# Patient Record
Sex: Male | Born: 1990 | Race: White | Hispanic: No | Marital: Single | Smoking: Current every day smoker
Health system: Southern US, Community
[De-identification: ages and names within clinical notes are randomized; demographics above are authoritative.]

## PROBLEM LIST (undated history)

## (undated) DIAGNOSIS — N200 Calculus of kidney: Secondary | ICD-10-CM

## (undated) DIAGNOSIS — F419 Anxiety disorder, unspecified: Secondary | ICD-10-CM

## (undated) DIAGNOSIS — F32A Depression, unspecified: Secondary | ICD-10-CM

## (undated) DIAGNOSIS — G43909 Migraine, unspecified, not intractable, without status migrainosus: Secondary | ICD-10-CM

## (undated) DIAGNOSIS — G4700 Insomnia, unspecified: Secondary | ICD-10-CM

---

## 2015-12-05 ENCOUNTER — Encounter (HOSPITAL_BASED_OUTPATIENT_CLINIC_OR_DEPARTMENT_OTHER): Payer: Self-pay

## 2015-12-05 ENCOUNTER — Emergency Department (HOSPITAL_BASED_OUTPATIENT_CLINIC_OR_DEPARTMENT_OTHER)
Admission: EM | Admit: 2015-12-05 | Discharge: 2015-12-05 | Disposition: A | Discharge status: Home / Self Care | Payer: Managed Care, Other (non HMO) | Attending: Emergency Medicine | Admitting: Emergency Medicine

## 2015-12-05 ENCOUNTER — Emergency Department (HOSPITAL_BASED_OUTPATIENT_CLINIC_OR_DEPARTMENT_OTHER): Payer: Managed Care, Other (non HMO)

## 2015-12-05 DIAGNOSIS — R110 Nausea: Secondary | ICD-10-CM | POA: Insufficient documentation

## 2015-12-05 DIAGNOSIS — R1031 Right lower quadrant pain: Secondary | ICD-10-CM | POA: Diagnosis not present

## 2015-12-05 DIAGNOSIS — F17200 Nicotine dependence, unspecified, uncomplicated: Secondary | ICD-10-CM | POA: Insufficient documentation

## 2015-12-05 HISTORY — DX: Calculus of kidney: N20.0

## 2015-12-05 LAB — CBC WITH DIFFERENTIAL/PLATELET
BASOS ABS: 0 10*3/uL (ref 0.0–0.1)
Basophils Relative: 0 %
EOS ABS: 0.1 10*3/uL (ref 0.0–0.7)
EOS PCT: 1 %
HCT: 47.1 % (ref 39.0–52.0)
HEMOGLOBIN: 16.2 g/dL (ref 13.0–17.0)
LYMPHS ABS: 3 10*3/uL (ref 0.7–4.0)
LYMPHS PCT: 27 %
MCH: 31.3 pg (ref 26.0–34.0)
MCHC: 34.4 g/dL (ref 30.0–36.0)
MCV: 91.1 fL (ref 78.0–100.0)
Monocytes Absolute: 1 10*3/uL (ref 0.1–1.0)
Monocytes Relative: 9 %
NEUTROS PCT: 63 %
Neutro Abs: 7 10*3/uL (ref 1.7–7.7)
PLATELETS: 208 10*3/uL (ref 150–400)
RBC: 5.17 MIL/uL (ref 4.22–5.81)
RDW: 12.4 % (ref 11.5–15.5)
WBC: 11.1 10*3/uL — AB (ref 4.0–10.5)

## 2015-12-05 LAB — COMPREHENSIVE METABOLIC PANEL
ALK PHOS: 61 U/L (ref 38–126)
ALT: 24 U/L (ref 17–63)
AST: 20 U/L (ref 15–41)
Albumin: 4.8 g/dL (ref 3.5–5.0)
Anion gap: 9 (ref 5–15)
BUN: 6 mg/dL (ref 6–20)
CALCIUM: 9.7 mg/dL (ref 8.9–10.3)
CHLORIDE: 103 mmol/L (ref 101–111)
CO2: 27 mmol/L (ref 22–32)
CREATININE: 0.84 mg/dL (ref 0.61–1.24)
GFR calc Af Amer: >60 mL/min (ref >60)
GFR calc non Af Amer: >60 mL/min (ref >60)
GLUCOSE: 94 mg/dL (ref 65–99)
Potassium: 4 mmol/L (ref 3.5–5.1)
SODIUM: 139 mmol/L (ref 135–145)
Total Bilirubin: 0.7 mg/dL (ref 0.3–1.2)
Total Protein: 7.3 g/dL (ref 6.5–8.1)

## 2015-12-05 LAB — LIPASE, BLOOD: Lipase: 15 U/L (ref 11–51)

## 2015-12-05 LAB — URINALYSIS, ROUTINE W REFLEX MICROSCOPIC
Bilirubin Urine: NEGATIVE
Glucose, UA: NEGATIVE mg/dL
HGB URINE DIPSTICK: NEGATIVE
Ketones, ur: NEGATIVE mg/dL
Leukocytes, UA: NEGATIVE
NITRITE: NEGATIVE
PROTEIN: NEGATIVE mg/dL
SPECIFIC GRAVITY, URINE: 1.005 (ref 1.005–1.030)
pH: 6.5 (ref 5.0–8.0)

## 2015-12-05 MED ORDER — MORPHINE SULFATE (PF) 4 MG/ML IV SOLN
4.0000 mg | Freq: Once | INTRAVENOUS | Status: AC
  Administered 2015-12-05: 4 mg via INTRAVENOUS
  Filled 2015-12-05: qty 1

## 2015-12-05 MED ORDER — ONDANSETRON HCL 4 MG/2ML IJ SOLN
4.0000 mg | Freq: Once | INTRAMUSCULAR | Status: AC
  Administered 2015-12-05: 4 mg via INTRAVENOUS
  Filled 2015-12-05: qty 2

## 2015-12-05 MED ORDER — ONDANSETRON 4 MG PO TBDP: ORAL_TABLET | ORAL | Status: DC

## 2015-12-05 MED ORDER — IOPAMIDOL (ISOVUE-300) INJECTION 61%
100.0000 mL | Freq: Once | INTRAVENOUS | Status: AC | PRN
  Administered 2015-12-05: 100 mL via INTRAVENOUS

## 2015-12-05 MED ORDER — SODIUM CHLORIDE 0.9 % IV BOLUS (SEPSIS)
1000.0000 mL | Freq: Once | INTRAVENOUS | Status: AC
  Administered 2015-12-05: 1000 mL via INTRAVENOUS

## 2015-12-05 NOTE — ED Notes (Signed)
RLQ pain and back pain started yesterday-nausea-no v/d-seen by PCP today-labs drawn no results-was advised to come to ED if developed fever-c/o chills x 1 hour-NAD-steady gait

## 2015-12-05 NOTE — Discharge Instructions (Signed)
Take 4 over the counter ibuprofen tablets 3 times a day or 2 over-the-counter naproxen tablets twice a day for pain. ° °Abdominal Pain, Adult °Many things can cause abdominal pain. Usually, abdominal pain is not caused by a disease and will improve without treatment. It can often be observed and treated at home. Your health care provider will do a physical exam and possibly order blood tests and X-rays to help determine the seriousness of your pain. However, in many cases, more time must pass before a clear cause of the pain can be found. Before that point, your health care provider may not know if you need more testing or further treatment. °HOME CARE INSTRUCTIONS °Monitor your abdominal pain for any changes. The following actions may help to alleviate any discomfort you are experiencing: °· Only take over-the-counter or prescription medicines as directed by your health care provider. °· Do not take laxatives unless directed to do so by your health care provider. °· Try a clear liquid diet (broth, tea, or water) as directed by your health care provider. Slowly move to a bland diet as tolerated. °SEEK MEDICAL CARE IF: °· You have unexplained abdominal pain. °· You have abdominal pain associated with nausea or diarrhea. °· You have pain when you urinate or have a bowel movement. °· You experience abdominal pain that wakes you in the night. °· You have abdominal pain that is worsened or improved by eating food. °· You have abdominal pain that is worsened with eating fatty foods. °· You have a fever. °SEEK IMMEDIATE MEDICAL CARE IF: °· Your pain does not go away within 2 hours. °· You keep throwing up (vomiting). °· Your pain is felt only in portions of the abdomen, such as the right side or the left lower portion of the abdomen. °· You pass bloody or black tarry stools. °MAKE SURE YOU: °· Understand these instructions. °· Will watch your condition. °· Will get help right away if you are not doing well or get worse. °  °This information is not intended to replace advice given to you by your health care provider. Make sure you discuss any questions you have with your health care provider. °  °Document Released: 03/06/2005 Document Revised: 02/15/2015 Document Reviewed: 02/03/2013 °Elsevier Interactive Patient Education ©2016 [employer] ° °

## 2015-12-05 NOTE — ED Notes (Signed)
Patient transported to CT 

## 2015-12-05 NOTE — ED Provider Notes (Signed)
CSN: [id number]     Arrival date & time 12/05/15  1755 History   By signing my name below, I, [facility], attest that this documentation has been prepared under the direction and in the presence of Melene Plan, DO. Electronically Signed: Randell Patient, ED Scribe. 12/05/2015. 8:02 PM.    Chief Complaint  Patient presents with  . Abdominal Pain    Patient is a 25 y.o. male presenting with abdominal pain. The history is provided by the patient. No language interpreter was used.  Abdominal Pain Pain location:  RLQ Pain quality: sharp   Pain radiates to:  Does not radiate Pain severity:  Moderate Onset quality:  Gradual Duration:  1 day Timing:  Constant Progression:  Worsening Chronicity:  New Context: not previous surgeries, not retching and not trauma   Relieved by:  None tried Worsened by:  Movement and palpation Ineffective treatments:  None tried Associated symptoms: chills and nausea   Associated symptoms: no chest pain, no diarrhea, no dysuria, no fever, no shortness of breath and no vomiting   Risk factors: not elderly and not pregnant    HPI Comments: Shneur Valdes is a 25 y.o. male who presents to the Emergency Department complaining of constant, gradually worsening, moderate, RLQ abdominal pain onset last night. Pt states that he had mild abdominal pain and nausea last night and was seen by PCP earlier today where he had labs drawn (results pending) and who advised the pt to present to the ED for further evaluation if fever or dizzness presented. He reports associated nausea, chills, and urinary frequency. He eaten a small amount today. Denies dysuria, testicular pain, vomiting, diarrhea, or any other symptoms currently.  Past Medical History  Diagnosis Date  . Kidney stone    History reviewed. No pertinent past surgical history. No family history on file. Social History  Substance Use Topics  . Smoking status: Current Every Day Smoker  . Smokeless tobacco:  None  . Alcohol Use: Yes     Comment: monthly    Review of Systems  Constitutional: Positive for chills. Negative for fever.  HENT: Negative for congestion and facial swelling.   Eyes: Negative for discharge and visual disturbance.  Respiratory: Negative for shortness of breath.   Cardiovascular: Negative for chest pain and palpitations.  Gastrointestinal: Positive for nausea and abdominal pain. Negative for vomiting and diarrhea.  Genitourinary: Positive for frequency. Negative for dysuria and testicular pain.  Musculoskeletal: Negative for myalgias and arthralgias.  Skin: Negative for color change and rash.  Neurological: Negative for tremors, syncope and headaches.  Psychiatric/Behavioral: Negative for confusion and dysphoric mood.  All other systems reviewed and are negative.     Allergies  Review of patient's allergies indicates no known allergies.  Home Medications   Prior to Admission medications   Medication Sig Start Date End Date Taking? Authorizing Provider  ondansetron (ZOFRAN ODT) 4 MG disintegrating tablet  ODT q4 hours prn nausea/vomit 12/05/15   Melene Plan, DO   BP 140/102 mmHg  Pulse 96  Temp(Src) 99 F (37.2 C) (Oral)  Resp 16  Ht  (1.854 m)  Wt 214 lb (97.07 kg)  BMI 28.24 kg/m2  SpO2 100% Physical Exam  Constitutional: He is oriented to person, place, and time. He appears well-developed and well-nourished.  HENT:  Head: Normocephalic and atraumatic.  Eyes: EOM are normal. Pupils are equal, round, and reactive to light.  Neck: Normal range of motion. Neck supple. No JVD present.  Cardiovascular: Normal rate and  regular rhythm.  Exam reveals no gallop and no friction rub.   No murmur heard. Pulmonary/Chest: No respiratory distress. He has no wheezes.  Abdominal: He exhibits no distension. There is tenderness in the right lower quadrant. There is rebound and tenderness at McBurney's point. There is no guarding and negative Murphy's sign.   Tenderness to the RLQ. There is some rebound. No guarding. Positive McBurney's point. Negative Murphy's sign. Negative obturator. Negative psoas. Negative Rovsing's.  Musculoskeletal: Normal range of motion.  Neurological: He is alert and oriented to person, place, and time.  Skin: No rash noted. No pallor.  Psychiatric: He has a normal mood and affect. His behavior is normal.  Nursing note and vitals reviewed.   ED Course  Procedures   DIAGNOSTIC STUDIES: Oxygen Saturation is 100% on RA, normal by my interpretation.    COORDINATION OF CARE: 7:21 PM Will order abdomen CT scan, labs, IV fluids, morphine, and Zofran. Discussed treatment plan with pt at bedside and pt agreed to plan.   Labs Review Labs Reviewed  CBC WITH DIFFERENTIAL/PLATELET - Abnormal; Notable for the following:    WBC 11.1 (*)    All other components within normal limits  URINALYSIS, ROUTINE W REFLEX MICROSCOPIC (NOT AT [facility])  COMPREHENSIVE METABOLIC PANEL  LIPASE, BLOOD    Imaging Review Ct Abdomen Pelvis W Contrast  12/05/2015  CLINICAL DATA:  25 year old male with right lower quadrant abdominal pain and nausea EXAM: CT ABDOMEN AND PELVIS WITH CONTRAST TECHNIQUE: Multidetector CT imaging of the abdomen and pelvis was performed using the standard protocol following bolus administration of intravenous contrast. CONTRAST:  100mL ISOVUE-300 IOPAMIDOL (ISOVUE-300) INJECTION 61% COMPARISON:  None. FINDINGS: The visualized lung bases are clear. No intra-abdominal free air or free fluid. The liver, gallbladder, pancreas, spleen, adrenal glands, kidneys, visualized ureters, and urinary bladder appear unremarkable. The prostate and seminal vesicles are grossly unremarkable. There is no evidence of bowel obstruction or active inflammation. Normal appendix. The abdominal aorta and IVC appear unremarkable. No portal venous gas identified. There is no adenopathy. There is a small fat containing umbilical hernia. The abdominal  wall soft tissues are otherwise unremarkable. The osseous structures are intact. IMPRESSION: No acute intra-abdominal or pelvic pathology.  Normal appendix. Electronically Signed   By: Elgie CollardArash  Radparvar M.D.   On: 12/05/2015 20:45   I have personally reviewed and evaluated these images and lab results as part of my medical decision-making.   EKG Interpretation None      MDM   Final diagnoses:  Right lower quadrant abdominal pain    25 yo M With a chief complaint of right lower quadrant abdominal tenderness. Significant tenderness on exam. CT scan is negative. Patient feeling better post fluids and pain medicine. PCP follow-up.  I personally performed the services described in this documentation, which was scribed in my presence. The recorded information has been reviewed and is accurate.   11:20 PM:  I have discussed the diagnosis/risks/treatment options with the patient and family and believe the pt to be eligible for discharge home to follow-up with PCP. We also discussed returning to the ED immediately if new or worsening sx occur. We discussed the sx which are most concerning (e.g., sudden worsening pain, fever, inability to tolerate by mouth) that necessitate immediate return. Medications administered to the patient during their visit and any new prescriptions provided to the patient are listed below.  Medications given during this visit Medications  sodium chloride 0.9 % bolus 1,000 mL (0 mLs Intravenous Stopped 12/05/15  2015)  morphine 4 MG/ML injection 4 mg (4 mg Intravenous Given 12/05/15 1934)  ondansetron (ZOFRAN) injection 4 mg (4 mg Intravenous Given 12/05/15 1934)  iopamidol (ISOVUE-300) 61 % injection 100 mL (100 mLs Intravenous Contrast Given 12/05/15 2036)    Discharge Medication List as of 12/05/2015  8:55 PM    START taking these medications   Details  ondansetron (ZOFRAN ODT) 4 MG disintegrating tablet 4mg  ODT q4 hours prn nausea/vomit, Print        The patient  appears reasonably screen and/or stabilized for discharge and I doubt any other medical condition or other [facility] requiring further screening, evaluation, or treatment in the ED at this time prior to discharge.     Melene Planan [place] Wiseman, DO 12/05/15 2321

## 2018-07-17 ENCOUNTER — Other Ambulatory Visit: Payer: Self-pay

## 2018-07-17 ENCOUNTER — Encounter (HOSPITAL_COMMUNITY): Payer: Self-pay | Admitting: Emergency Medicine

## 2018-07-17 ENCOUNTER — Emergency Department (HOSPITAL_COMMUNITY)
Admission: EM | Admit: 2018-07-17 | Discharge: 2018-07-17 | Disposition: A | Discharge status: Home / Self Care | Payer: BLUE CROSS/BLUE SHIELD | Attending: Emergency Medicine | Admitting: Emergency Medicine

## 2018-07-17 DIAGNOSIS — F17210 Nicotine dependence, cigarettes, uncomplicated: Secondary | ICD-10-CM | POA: Insufficient documentation

## 2018-07-17 DIAGNOSIS — J101 Influenza due to other identified influenza virus with other respiratory manifestations: Secondary | ICD-10-CM | POA: Insufficient documentation

## 2018-07-17 DIAGNOSIS — Z5181 Encounter for therapeutic drug level monitoring: Secondary | ICD-10-CM | POA: Diagnosis not present

## 2018-07-17 DIAGNOSIS — Z79899 Other long term (current) drug therapy: Secondary | ICD-10-CM | POA: Diagnosis not present

## 2018-07-17 DIAGNOSIS — Z113 Encounter for screening for infections with a predominantly sexual mode of transmission: Secondary | ICD-10-CM | POA: Diagnosis not present

## 2018-07-17 DIAGNOSIS — G43909 Migraine, unspecified, not intractable, without status migrainosus: Secondary | ICD-10-CM | POA: Diagnosis not present

## 2018-07-17 DIAGNOSIS — R05 Cough: Secondary | ICD-10-CM | POA: Diagnosis not present

## 2018-07-17 DIAGNOSIS — J111 Influenza due to unidentified influenza virus with other respiratory manifestations: Secondary | ICD-10-CM | POA: Diagnosis not present

## 2018-07-17 HISTORY — DX: Migraine, unspecified, not intractable, without status migrainosus: G43.909

## 2018-07-17 HISTORY — DX: Insomnia, unspecified: G47.00

## 2018-07-17 MED ORDER — ONDANSETRON 8 MG PO TBDP
8.0000 mg | ORAL_TABLET | Freq: Once | ORAL | Status: AC
  Administered 2018-07-17: 8 mg via ORAL
  Filled 2018-07-17: qty 1

## 2018-07-17 MED ORDER — ALBUTEROL SULFATE HFA 108 (90 BASE) MCG/ACT IN AERS
2.0000 | INHALATION_SPRAY | Freq: Once | RESPIRATORY_TRACT | Status: AC
  Administered 2018-07-17: 2 via RESPIRATORY_TRACT
  Filled 2018-07-17: qty 6.7

## 2018-07-17 MED ORDER — BENZONATATE 100 MG PO CAPS
200.0000 mg | ORAL_CAPSULE | Freq: Once | ORAL | Status: AC
  Administered 2018-07-17: 200 mg via ORAL
  Filled 2018-07-17: qty 2

## 2018-07-17 MED ORDER — ACETAMINOPHEN 325 MG PO TABS
ORAL_TABLET | ORAL | Status: AC
  Filled 2018-07-17: qty 2

## 2018-07-17 MED ORDER — IBUPROFEN 800 MG PO TABS: 800.0000 mg | ORAL_TABLET | Freq: Three times a day (TID) | ORAL | 0 refills | Status: DC

## 2018-07-17 MED ORDER — IBUPROFEN 400 MG PO TABS
600.0000 mg | ORAL_TABLET | Freq: Once | ORAL | Status: AC
  Administered 2018-07-17: 600 mg via ORAL
  Filled 2018-07-17: qty 2

## 2018-07-17 MED ORDER — BENZONATATE 200 MG PO CAPS: 200.0000 mg | ORAL_CAPSULE | Freq: Three times a day (TID) | ORAL | 0 refills | Status: DC | PRN

## 2018-07-17 MED ORDER — ACETAMINOPHEN 325 MG PO TABS: 650.0000 mg | ORAL_TABLET | Freq: Once | ORAL | Status: DC | PRN

## 2018-07-17 NOTE — ED Triage Notes (Signed)
Diagnosed with flu Wednesday. Started tamiflu Thursday. Here today for return of fever and hard to breath. Pt NAD in triage.pt has not taken any medication for fever.

## 2018-07-17 NOTE — Discharge Instructions (Addendum)
Continue taking your tamiflu as directed.  Take tylenol every 4 hrs for at least another 48 hrs.  Its important to drink plenty of fluids.  Follow-up with your doctor for recheck if needed

## 2018-07-17 NOTE — ED Provider Notes (Signed)
[facility]ERGENCY DEPARTMENT Provider Note   CSN: [id number] Arrival date & time: 07/17/18  2022     History   Chief Complaint Chief Complaint  Patient presents with  . Influenza    HPI Anthony Cruz is a 28 y.o. male.  HPI   Anthony Cruz is a 28 y.o. male who presents to the Emergency Department complaining of fever and return of body aches and cough.  He was diagnosed with the flu two days ago and has been taking tamiflu, tylenol and a decongestant.  Last dose of tylenol was earlier today.  Woke from a nap with chills and fever and tightness in his chest associated with his cough.  He denies sore throat, abdominal pain, vomiting and diarrhea.    Past Medical History:  Diagnosis Date  . Insomnia   . Kidney stone   . Migraines     There are no active problems to display for this patient.   History reviewed. No pertinent surgical history.    Home Medications    Prior to Admission medications   Medication Sig Start Date End Date Taking? Authorizing Provider  ondansetron (ZOFRAN ODT) 4 MG disintegrating tablet 4mg  ODT q4 hours prn nausea/vomit 12/05/15   Melene PlanFloyd, Dan, DO    Family History History reviewed. No pertinent family history.  Social History Social History   Tobacco Use  . Smoking status: Current Every Day Smoker  . Smokeless tobacco: Never Used  Substance Use Topics  . Alcohol use: Yes    Comment: monthly  . Drug use: Yes    Types: Marijuana     Allergies   Patient has no known allergies.   Review of Systems Review of Systems  Constitutional: Positive for fever. Negative for activity change, appetite change and chills.  HENT: Positive for congestion. Negative for facial swelling, rhinorrhea, sore throat and trouble swallowing.   Eyes: Negative for visual disturbance.  Respiratory: Positive for cough. Negative for shortness of breath, wheezing and stridor.   Cardiovascular: Negative for chest pain.  Gastrointestinal: Negative for abdominal  pain, nausea and vomiting.  Genitourinary: Negative for decreased urine volume and dysuria.  Musculoskeletal: Positive for myalgias (gneralized body aches). Negative for neck pain and neck stiffness.  Skin: Negative for rash.  Neurological: Negative for dizziness, weakness, numbness and headaches.  Hematological: Negative for adenopathy.  Psychiatric/Behavioral: Negative for confusion.  All other systems reviewed and are negative.    Physical Exam Updated Vital Signs BP 136/86 (BP Location: Right Arm)   Pulse (!) 122   Temp (!) 102.1 F (38.9 C) (Oral)   Resp 18   Ht 6\' 1"  (1.854 m)   Wt 93.9 kg   SpO2 99%   BMI 27.31 kg/m   Physical Exam Vitals signs and nursing note reviewed.  Constitutional:      General: He is not in acute distress.    Appearance: Normal appearance. He is well-developed. He is not ill-appearing.  HENT:     Head: Normocephalic.     Jaw: No trismus.     Right Ear: Tympanic membrane and ear canal normal.     Left Ear: Tympanic membrane and ear canal normal.     Nose: Mucosal edema and rhinorrhea present.     Mouth/Throat:     Pharynx: Uvula midline. Posterior oropharyngeal erythema present. No oropharyngeal exudate or uvula swelling.     Tonsils: No tonsillar abscesses.  Eyes:     Conjunctiva/sclera: Conjunctivae normal.  Neck:     Musculoskeletal: Full passive  range of motion without pain, normal range of motion and neck supple. No neck rigidity.     Trachea: Phonation normal.     Meningeal: Kernig's sign absent.  Cardiovascular:     Rate and Rhythm: Normal rate and regular rhythm.  Pulmonary:     Effort: Pulmonary effort is normal. No respiratory distress.     Breath sounds: Wheezing present. No rales.     Comments: occasional expiratory wheezes.  No rales or respiratory distress noted.   Abdominal:     General: There is no distension.     Palpations: Abdomen is soft.     Tenderness: There is no abdominal tenderness. There is no guarding or  rebound.  Musculoskeletal: Normal range of motion.  Lymphadenopathy:     Cervical: No cervical adenopathy.  Skin:    General: Skin is warm and dry.     Findings: No rash.  Neurological:     Mental Status: He is alert. Mental status is at baseline.     Sensory: No sensory deficit.     Motor: No abnormal muscle tone.      ED Treatments / Results  Labs (all labs ordered are listed, but only abnormal results are displayed) Labs Reviewed - No data to display  EKG None  Radiology No results found.  Procedures Procedures (including critical care time)  Medications Ordered in ED Medications  acetaminophen (TYLENOL) 325 MG tablet (  Not Given 07/17/18 2057)  albuterol (PROVENTIL HFA;VENTOLIN HFA) 108 (90 Base) MCG/ACT inhaler 2 puff (has no administration in time range)  benzonatate (TESSALON) capsule 200 mg (has no administration in time range)  ibuprofen (ADVIL,MOTRIN) tablet 600 mg (600 mg Oral Given 07/17/18 2059)     Initial Impression / Assessment and Plan / ED Course  I have reviewed the triage vital signs and the nursing notes.  Pertinent labs & imaging results that were available during my care of the patient were reviewed by me and considered in my medical decision making (see chart for details).     Pt with the flu.  Currently taking tamiflu and tylenol.   Albuterol MDI dispensed, lung sounds improved after 2 puffs.  Vitals improved after ibuprofen and oral fluids.  He appears appropriate for d/c home and rx written for ibuprofen and tessalon    Final Clinical Impressions(s) / ED Diagnoses   Final diagnoses:  Influenza    ED Discharge Orders    None       Pauline Aus, PA-C 07/18/18 1337    Vanetta Mulders, MD 07/18/18 1654

## 2018-07-17 NOTE — ED Notes (Signed)
Patient drinking oral fluids at this time

## 2018-10-16 DIAGNOSIS — Z5181 Encounter for therapeutic drug level monitoring: Secondary | ICD-10-CM | POA: Diagnosis not present

## 2018-11-20 DIAGNOSIS — Z5181 Encounter for therapeutic drug level monitoring: Secondary | ICD-10-CM | POA: Diagnosis not present

## 2018-11-20 DIAGNOSIS — M898X1 Other specified disorders of bone, shoulder: Secondary | ICD-10-CM | POA: Diagnosis not present

## 2018-11-20 DIAGNOSIS — J302 Other seasonal allergic rhinitis: Secondary | ICD-10-CM | POA: Diagnosis not present

## 2018-11-20 DIAGNOSIS — F419 Anxiety disorder, unspecified: Secondary | ICD-10-CM | POA: Diagnosis not present

## 2019-04-06 ENCOUNTER — Other Ambulatory Visit: Payer: Self-pay

## 2019-04-06 ENCOUNTER — Emergency Department (HOSPITAL_COMMUNITY)
Admission: EM | Admit: 2019-04-06 | Discharge: 2019-04-06 | Disposition: A | Discharge status: Home / Self Care | Payer: BC Managed Care – PPO | Attending: Emergency Medicine | Admitting: Emergency Medicine

## 2019-04-06 ENCOUNTER — Encounter (HOSPITAL_COMMUNITY): Payer: Self-pay

## 2019-04-06 ENCOUNTER — Emergency Department (HOSPITAL_COMMUNITY): Payer: BC Managed Care – PPO

## 2019-04-06 DIAGNOSIS — R39198 Other difficulties with micturition: Secondary | ICD-10-CM | POA: Diagnosis not present

## 2019-04-06 DIAGNOSIS — F1210 Cannabis abuse, uncomplicated: Secondary | ICD-10-CM | POA: Diagnosis not present

## 2019-04-06 DIAGNOSIS — N200 Calculus of kidney: Secondary | ICD-10-CM | POA: Insufficient documentation

## 2019-04-06 DIAGNOSIS — R1032 Left lower quadrant pain: Secondary | ICD-10-CM | POA: Diagnosis not present

## 2019-04-06 DIAGNOSIS — F17210 Nicotine dependence, cigarettes, uncomplicated: Secondary | ICD-10-CM | POA: Insufficient documentation

## 2019-04-06 DIAGNOSIS — R109 Unspecified abdominal pain: Secondary | ICD-10-CM | POA: Diagnosis not present

## 2019-04-06 LAB — URINALYSIS, ROUTINE W REFLEX MICROSCOPIC
Bacteria, UA: NONE SEEN
Glucose, UA: NEGATIVE mg/dL
Ketones, ur: NEGATIVE mg/dL
Leukocytes,Ua: NEGATIVE
Nitrite: NEGATIVE
Protein, ur: 30 mg/dL — AB
Specific Gravity, Urine: 1.023 (ref 1.005–1.030)
pH: 6 (ref 5.0–8.0)

## 2019-04-06 MED ORDER — HYDROCODONE-ACETAMINOPHEN 5-325 MG PO TABS: 1.0000 | ORAL_TABLET | Freq: Four times a day (QID) | ORAL | 0 refills | Status: DC | PRN

## 2019-04-06 MED ORDER — KETOROLAC TROMETHAMINE 30 MG/ML IJ SOLN
30.0000 mg | Freq: Once | INTRAMUSCULAR | Status: AC
  Administered 2019-04-06: 30 mg via INTRAVENOUS
  Filled 2019-04-06: qty 1

## 2019-04-06 NOTE — ED Provider Notes (Signed)
[facility] EMERGENCY DEPARTMENT Provider Note   CSN: [id number] Arrival date & time: 04/06/19  1854     History   Chief Complaint Chief Complaint  Patient presents with  . Flank Pain    HPI Anthony Cruz is a 28 y.o. male.     Patient is a 28 year old male with history of prior renal calculus 7 years ago presenting with complaints of left flank pain.  This started suddenly this afternoon.  He has the urge to urinate but is having difficulty going.  This was accompanied by pain in his left lower quadrant radiating into his flank.  This feels similar to prior calculi.  He denies fevers or chills.  The history is provided by the patient.  Flank Pain This is a recurrent problem. The current episode started 3 to 5 hours ago. The problem occurs constantly. The problem has been rapidly worsening. Nothing aggravates the symptoms. Nothing relieves the symptoms. He has tried nothing for the symptoms.    Past Medical History:  Diagnosis Date  . Insomnia   . Kidney stone   . Migraines     There are no active problems to display for this patient.   History reviewed. No pertinent surgical history.      Home Medications    Prior to Admission medications   Medication Sig Start Date End Date Taking? Authorizing Provider  benzonatate (TESSALON) 200 MG capsule Take 1 capsule (200 mg total) by mouth 3 (three) times daily as needed for cough. Swallow whole, do not chew 07/17/18   Triplett, Tammy, PA-C  ibuprofen (ADVIL,MOTRIN) 800 MG tablet Take 1 tablet (800 mg total) by mouth 3 (three) times daily. 07/17/18   Triplett, Tammy, PA-C  ondansetron (ZOFRAN ODT) 4 MG disintegrating tablet 4mg  ODT q4 hours prn nausea/vomit 12/05/15   Deno Etienne, DO    Family History No family history on file.  Social History Social History   Tobacco Use  . Smoking status: Current Every Day Smoker    Packs/day: 0.50    Types: Cigarettes  . Smokeless tobacco: Never Used  Substance Use Topics  . Alcohol  use: Yes    Comment: monthly  . Drug use: Yes    Types: Marijuana     Allergies   Patient has no known allergies.   Review of Systems Review of Systems  Genitourinary: Positive for flank pain.  All other systems reviewed and are negative.    Physical Exam Updated Vital Signs BP (!) 144/97 (BP Location: Right Arm)   Pulse 95   Temp 98.9 F (37.2 C) (Oral)   Resp 18   Ht 6\' 1"  (1.854 m)   Wt 95.3 kg   SpO2 100%   BMI 27.71 kg/m   Physical Exam Vitals signs and nursing note reviewed.  Constitutional:      General: He is not in acute distress.    Appearance: He is well-developed. He is not diaphoretic.  HENT:     Head: Normocephalic and atraumatic.  Neck:     Musculoskeletal: Normal range of motion and neck supple.  Cardiovascular:     Rate and Rhythm: Normal rate and regular rhythm.     Heart sounds: No murmur. No friction rub.  Pulmonary:     Effort: Pulmonary effort is normal. No respiratory distress.     Breath sounds: Normal breath sounds. No wheezing or rales.  Abdominal:     General: Bowel sounds are normal. There is no distension.     Palpations: Abdomen is  soft.     Tenderness: There is no abdominal tenderness. There is left CVA tenderness.     Comments: There is mild left CVA tenderness noted.  Musculoskeletal: Normal range of motion.  Skin:    General: Skin is warm and dry.  Neurological:     Mental Status: He is alert and oriented to person, place, and time.     Coordination: Coordination normal.      ED Treatments / Results  Labs (all labs ordered are listed, but only abnormal results are displayed) Labs Reviewed  URINALYSIS, ROUTINE W REFLEX MICROSCOPIC - Abnormal; Notable for the following components:      Result Value   APPearance HAZY (*)    Hgb urine dipstick LARGE (*)    Bilirubin Urine SMALL (*)    Protein, ur 30 (*)    All other components within normal limits    EKG None  Radiology No results found.  Procedures  Procedures (including critical care time)  Medications Ordered in ED Medications  ketorolac (TORADOL) 30 MG/ML injection 30 mg (has no administration in time range)     Initial Impression / Assessment and Plan / ED Course  I have reviewed the triage vital signs and the nursing notes.  Pertinent labs & imaging results that were available during my care of the patient were reviewed by me and considered in my medical decision making (see chart for details).  Patient CT scan shows a punctate calculus in the left UVJ.  Patient feeling better after Toradol.  He will be discharged with hydrocodone and follow-up with urology if not improving.  Final Clinical Impressions(s) / ED Diagnoses   Final diagnoses:  None    ED Discharge Orders    None       Geoffery Lyons, MD 04/06/19 2224

## 2019-04-06 NOTE — ED Triage Notes (Signed)
Pt presents to ED with complaints of left sided flank pain and difficulty urinating. Pt states urine is also orange in color. Pt states this started 2-3 hours ago.

## 2019-04-06 NOTE — Discharge Instructions (Addendum)
Hydrocodone as prescribed as needed for pain.  Follow-up with urology if your symptoms or not improving in the next few days.  The contact information for [facility] in [place] has been provided in this discharge summary for you to call and make these arrangements.  Return to the emergency department if your symptoms worsen, you develop high fever, or for other new and concerning symptoms.

## 2019-05-10 DIAGNOSIS — Z23 Encounter for immunization: Secondary | ICD-10-CM | POA: Diagnosis not present

## 2019-05-10 DIAGNOSIS — F419 Anxiety disorder, unspecified: Secondary | ICD-10-CM | POA: Diagnosis not present

## 2019-05-10 DIAGNOSIS — F5101 Primary insomnia: Secondary | ICD-10-CM | POA: Diagnosis not present

## 2019-05-10 DIAGNOSIS — B070 Plantar wart: Secondary | ICD-10-CM | POA: Diagnosis not present

## 2019-11-22 ENCOUNTER — Emergency Department (HOSPITAL_COMMUNITY)
Admission: EM | Admit: 2019-11-22 | Discharge: 2019-11-23 | Disposition: A | Discharge status: Home / Self Care | Payer: BC Managed Care – PPO | Attending: Emergency Medicine | Admitting: Emergency Medicine

## 2019-11-22 ENCOUNTER — Emergency Department (HOSPITAL_COMMUNITY): Payer: BC Managed Care – PPO

## 2019-11-22 ENCOUNTER — Other Ambulatory Visit: Payer: Self-pay

## 2019-11-22 DIAGNOSIS — M549 Dorsalgia, unspecified: Secondary | ICD-10-CM | POA: Diagnosis not present

## 2019-11-22 NOTE — ED Triage Notes (Signed)
Patient states pain under his right rib cage area and that he is also having pain in his back. Patient denies any known injury. Patient concerned about pain and wanting an x-ray. Pain has been ongoing x 2 days.

## 2019-12-04 ENCOUNTER — Ambulatory Visit
Admission: EM | Admit: 2019-12-04 | Discharge: 2019-12-04 | Disposition: A | Discharge status: Home / Self Care | Payer: BC Managed Care – PPO | Attending: Emergency Medicine | Admitting: Emergency Medicine

## 2019-12-04 DIAGNOSIS — M62838 Other muscle spasm: Secondary | ICD-10-CM

## 2019-12-04 DIAGNOSIS — M5489 Other dorsalgia: Secondary | ICD-10-CM

## 2019-12-04 DIAGNOSIS — M549 Dorsalgia, unspecified: Secondary | ICD-10-CM | POA: Diagnosis not present

## 2019-12-04 MED ORDER — CYCLOBENZAPRINE HCL 10 MG PO TABS: 10.0000 mg | ORAL_TABLET | Freq: Every day | ORAL | 0 refills | Status: AC

## 2019-12-04 NOTE — ED Triage Notes (Signed)
Provider triage  

## 2019-12-04 NOTE — Discharge Instructions (Signed)
Continue conservative management of rest, ice, massage, and gentle stretches Continue with steroid as prescribed and to completion Take cyclobenzaprine at nighttime for symptomatic relief. Avoid driving or operating heavy machinery while using medication. Follow up with PCP if symptoms persist Return or go to the ER if you have any new or worsening symptoms (fever, chills, chest pain, abdominal pain, changes in bowel or bladder habits, pain radiating into lower legs, etc...)

## 2019-12-04 NOTE — ED Provider Notes (Signed)
[facility]   [id number] 12/04/19 Arrival Time: 1442  CC: Back PAIN  SUBJECTIVE: History from: patient. Anthony Cruz is a 29 y.o. male complains of LT upper back x 1 week.  Denies a precipitating event or specific injury.  Localizes the pain to the LT upper back and radiating in RT mid back.  Describes the pain as constant, dull and achy in character.  Has tried steroids, icing, heating, icy hot, and tylenol without relief.  Symptoms are made worse with movement.  Denies similar symptoms in the past.  Denies fever, chills, erythema, ecchymosis, effusion, weakness, numbness and tingling, saddle paresthesias, loss of bowel or bladder function.      ROS: As per HPI.  All other pertinent ROS negative.     Past Medical History:  Diagnosis Date  . Insomnia   . Kidney stone   . Migraines    No past surgical history on file. No Known Allergies No current facility-administered medications on file prior to encounter.   Current Outpatient Medications on File Prior to Encounter  Medication Sig Dispense Refill  . albuterol (VENTOLIN HFA) 108 (90 Base) MCG/ACT inhaler Inhale 1-2 puffs into the lungs every 6 (six) hours as needed for wheezing or shortness of breath.     Marland Kitchen amitriptyline (ELAVIL) 25 MG tablet Take 25 mg by mouth at bedtime.    . busPIRone (BUSPAR) 10 MG tablet Take 10 mg by mouth 2 (two) times daily.    . fluticasone (FLONASE) 50 MCG/ACT nasal spray Place 2 sprays into both nostrils daily as needed for allergies or rhinitis.     Marland Kitchen montelukast (SINGULAIR) 10 MG tablet Take 10 mg by mouth daily.    . SUMAtriptan (IMITREX) 50 MG tablet Take 50 mg by mouth every 2 (two) hours as needed for migraine or headache.     . zolpidem (AMBIEN) 10 MG tablet Take 10 mg by mouth at bedtime as needed for sleep.      Social History   Socioeconomic History  . Marital status: Single    Spouse name: Not on file  . Number of children: Not on file  . Years of education: Not on file  .  Highest education level: Not on file  Occupational History  . Not on file  Tobacco Use  . Smoking status: Current Every Day Smoker    Packs/day: 0.50    Types: Cigarettes  . Smokeless tobacco: Never Used  Vaping Use  . Vaping Use: Never used  Substance and Sexual Activity  . Alcohol use: Yes    Comment: monthly  . Drug use: Yes    Types: Marijuana  . Sexual activity: Not on file  Other Topics Concern  . Not on file  Social History Narrative  . Not on file   Social Determinants of Health   Financial Resource Strain:   . Difficulty of Paying Living Expenses:   Food Insecurity:   . Worried About Programme researcher, broadcasting/film/video in the Last Year:   . Barista in the Last Year:   Transportation Needs:   . Freight forwarder (Medical):   Marland Kitchen Lack of Transportation (Non-Medical):   Physical Activity:   . Days of Exercise per Week:   . Minutes of Exercise per Session:   Stress:   . Feeling of Stress :   Social Connections:   . Frequency of Communication with Friends and Family:   . Frequency of Social Gatherings with Friends and Family:   . Attends  Religious Services:   . Active Member of Clubs or Organizations:   . Attends Archivist Meetings:   Marland Kitchen Marital Status:   Intimate Partner Violence:   . Fear of Current or Ex-Partner:   . Emotionally Abused:   Marland Kitchen Physically Abused:   . Sexually Abused:    No family history on file.  OBJECTIVE:  Vitals:   12/04/19 1447  BP: (!) 144/93  Pulse: 87  Resp: 17  Temp: 98.3 F (36.8 C)  TempSrc: Oral  SpO2: 96%    General appearance: ALERT; in no acute distress.  Head: NCAT Lungs: Normal respiratory effort; CTAB CV: RRR Musculoskeletal: Back  Inspection: Skin warm, dry, clear and intact without obvious erythema, effusion, or ecchymosis.  Palpation: TTP over RT upper back/ trapezius ROM: FROM active and passive Strength: 5/5 shld abduction, 5/5 shld adduction, 5/5 elbow flexion, 5/5 elbow extension, 5/5 grip  strength, 5/5 hip flexion, 5/5 hip extension Skin: warm and dry Neurologic: Ambulates without difficulty; Sensation intact about the upper/ lower extremities Psychological: alert and cooperative; normal mood and affect   ASSESSMENT & PLAN:  1. Other acute back pain   2. Trapezius muscle spasm     Meds ordered this encounter  Medications  . cyclobenzaprine (FLEXERIL) 10 MG tablet    Sig: Take 1 tablet (10 mg total) by mouth at bedtime.    Dispense:  15 tablet    Refill:  0    Order Specific Question:   Supervising Provider    Answer:   Raylene Everts [[id number]]    Continue conservative management of rest, ice, massage, and gentle stretches Continue with steroid as prescribed and to completion Take cyclobenzaprine at nighttime for symptomatic relief. Avoid driving or operating heavy machinery while using medication. Follow up with PCP if symptoms persist Return or go to the ER if you have any new or worsening symptoms (fever, chills, chest pain, abdominal pain, changes in bowel or bladder habits, pain radiating into lower legs, etc...)    Reviewed expectations re: course of current medical issues. Questions answered. Outlined signs and symptoms indicating need for more acute intervention. Patient verbalized understanding. After Visit Summary given.    Lestine Box, PA-C 12/04/19 1458

## 2020-06-02 IMAGING — CT CT RENAL STONE PROTOCOL
2 of 4 series · 16 of 46 positions shown, 18 images · non-contrast
Comparison: CT of the abdomen pelvis dated 12/05/2015

CLINICAL DATA: 28-year-old male with right flank pain. Concern for
kidney stone.

EXAM:
CT ABDOMEN AND PELVIS WITHOUT CONTRAST
TECHNIQUE: Multidetector CT imaging of the abdomen and pelvis was performed
following the standard protocol without IV contrast.

[Series 2: axial st · axial · 0.85mm/px · z∈[+793,+1253]mm · 13 of 108 slices shown, 15 images]
[im 8/108  soft-tissue]
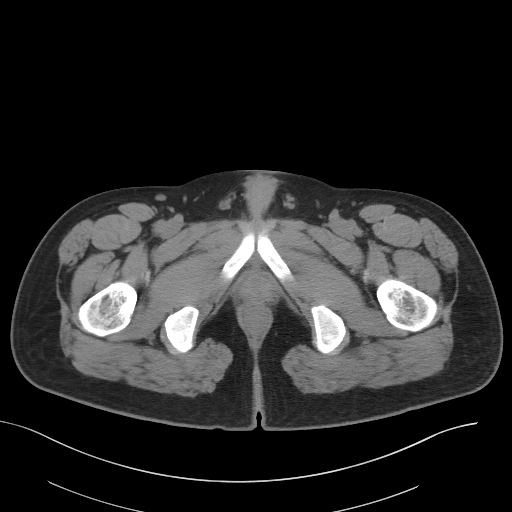
[im 8/108  bone]
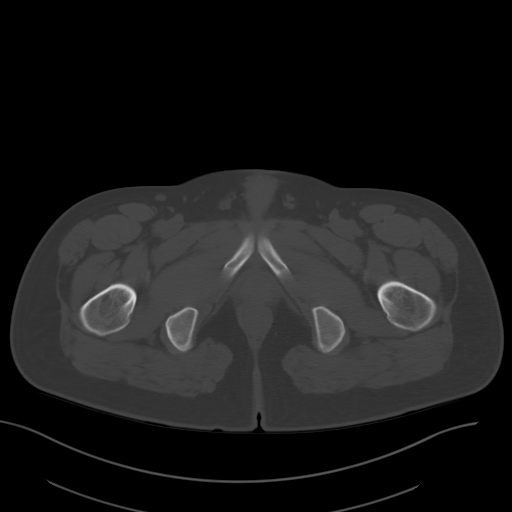
[im 15/108  soft-tissue]
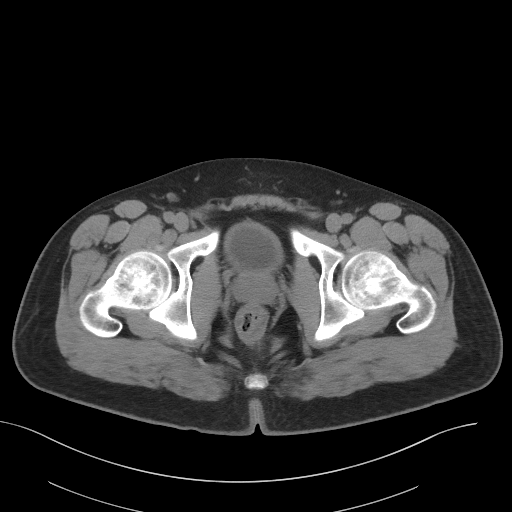
[im 22/108  soft-tissue]
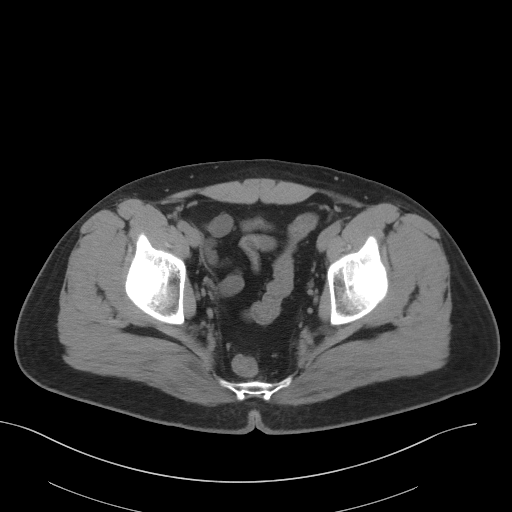
[im 29/108  soft-tissue]
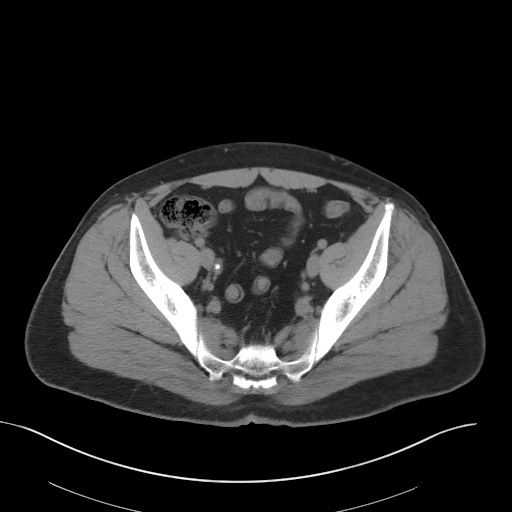
[im 36/108  soft-tissue]
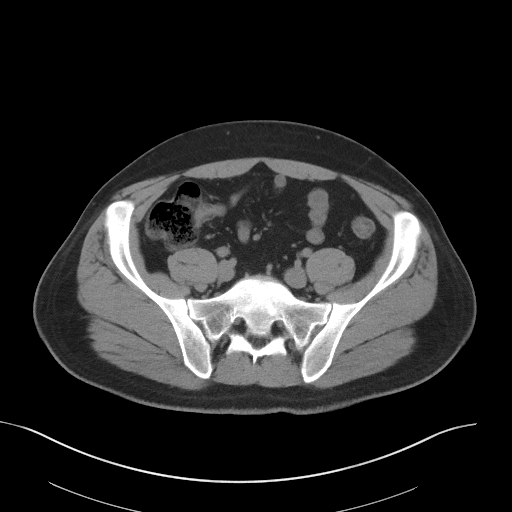
[im 43/108  soft-tissue]
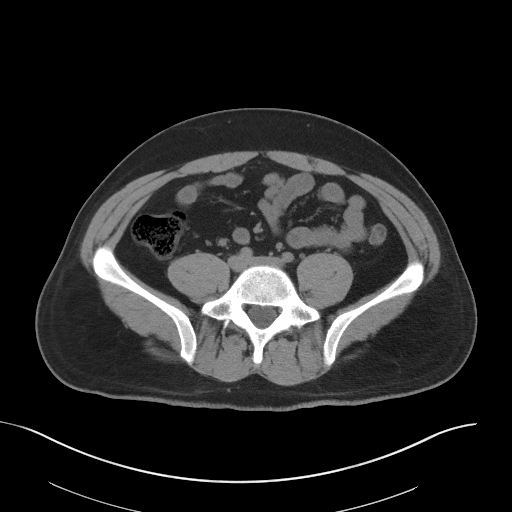
[im 58/108  soft-tissue]
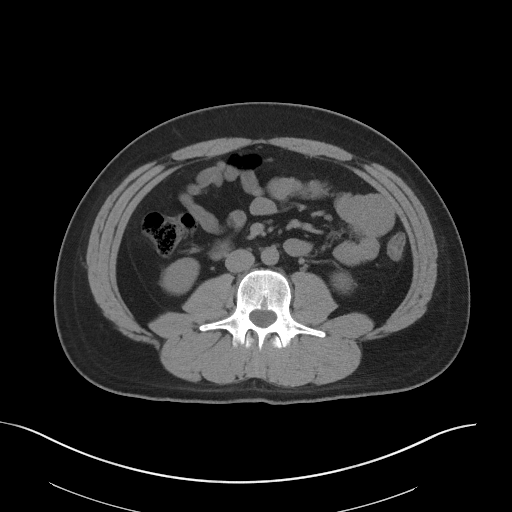
[im 65/108  soft-tissue]
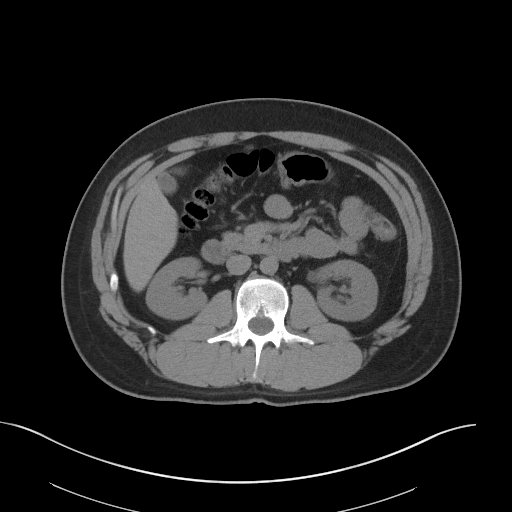
[im 72/108  soft-tissue]
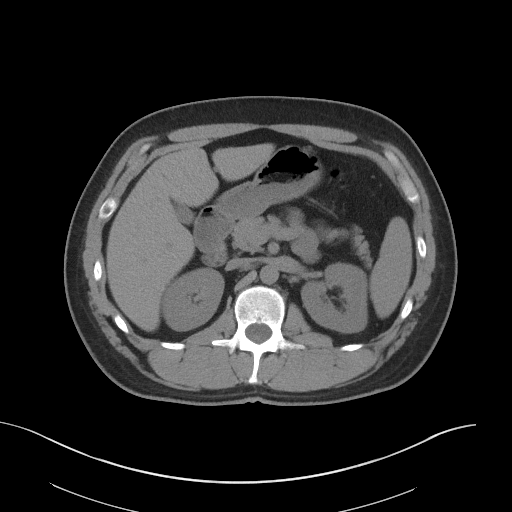
[im 72/108  bone]
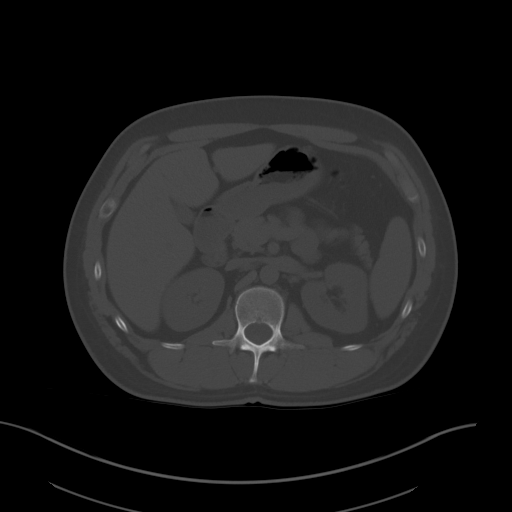
[im 79/108  soft-tissue]
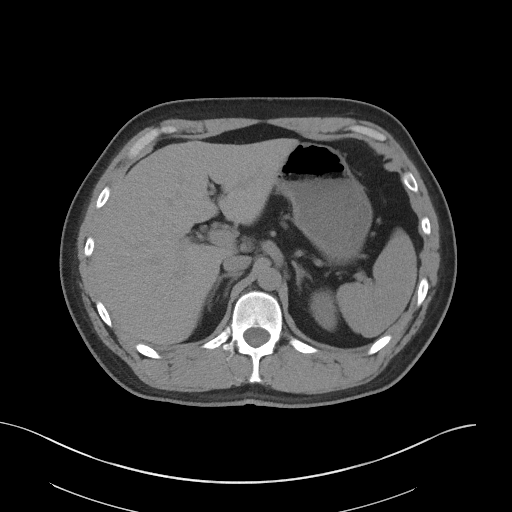
[im 86/108  soft-tissue]
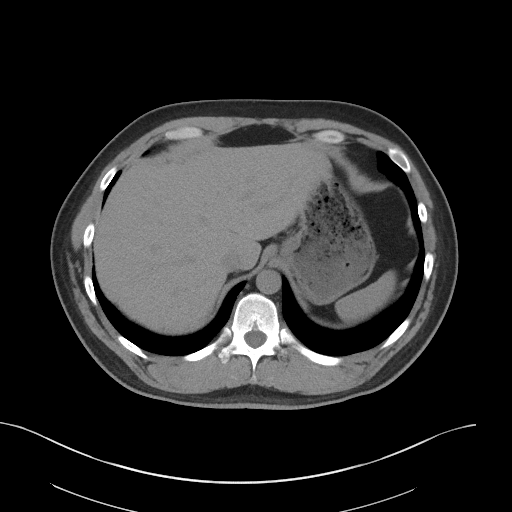
[im 93/108  soft-tissue]
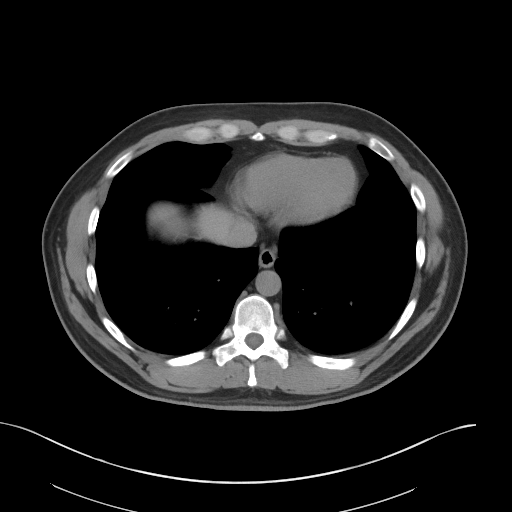
[im 100/108  soft-tissue]
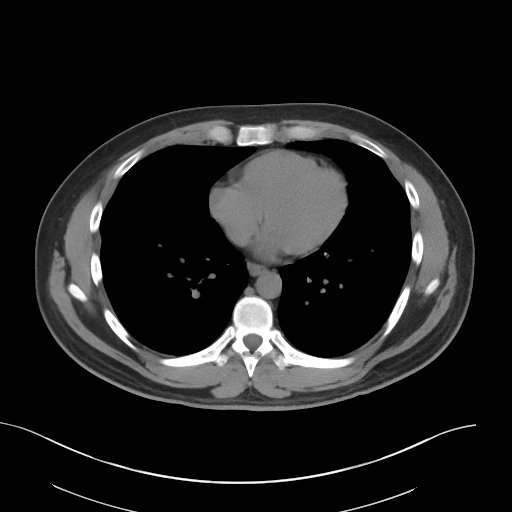

[Series 5: coronal st · coronal · 0.91mm/px · 3 of 87 slices shown]
[im 29/87  soft-tissue]
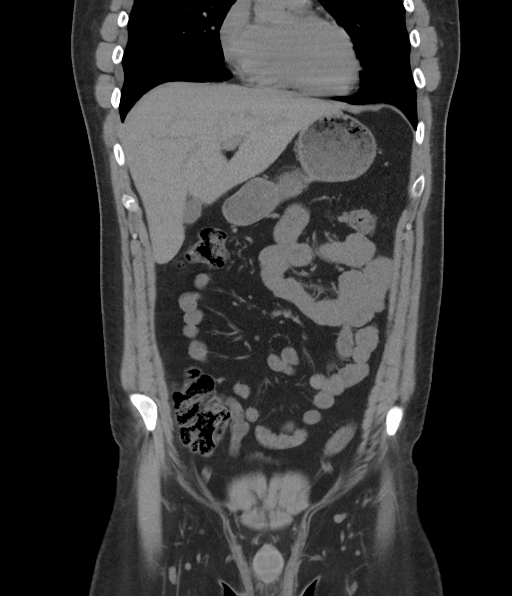
[im 39/87  soft-tissue]
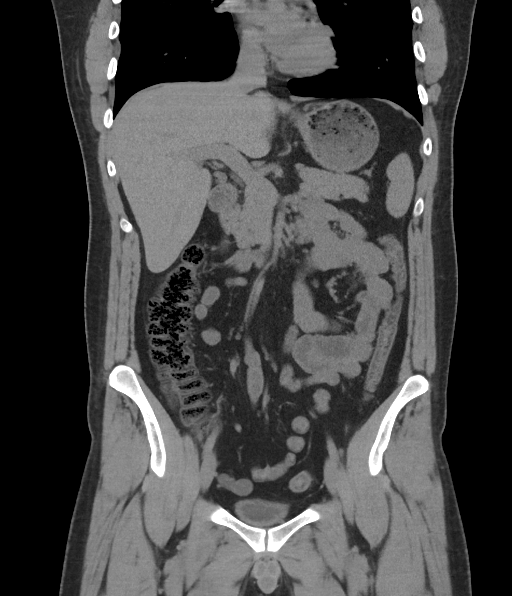
[im 48/87  soft-tissue]
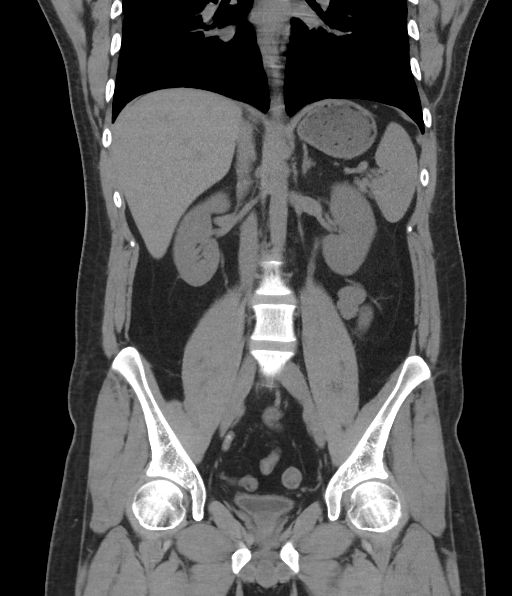

[16 of 46 positions shown; findings below may reference images not displayed]

FINDINGS: Evaluation of this exam is limited in the absence of intravenous
contrast.

Lower chest: A 3 mm right middle lobe subpleural nodule similar to
prior CT. The visualized lung bases are otherwise clear.

No intra-abdominal free air or free fluid.

Hepatobiliary: No focal liver abnormality is seen. No gallstones,
gallbladder wall thickening, or biliary dilatation.

Pancreas: Unremarkable. No pancreatic ductal dilatation or
surrounding inflammatory changes.

Spleen: Normal in size without focal abnormality.

Adrenals/Urinary Tract: The adrenal glands, and kidneys appear
unremarkable. There is a punctate calculus in the distal left ureter
close to the ureterovesical junction with mild fullness of the left
ureter. The right ureter appears unremarkable. Urinary bladder is
grossly unremarkable for the degree of distention.

Stomach/Bowel: Mild diffuse thickening of the descending colon and
rectosigmoid, likely related to underdistention. Colitis is less
likely. Clinical correlation is recommended. There is no bowel
obstruction. Several stones noted in the appendix. The appendix is
otherwise unremarkable.

Vascular/Lymphatic: The abdominal aorta and IVC are grossly
unremarkable on this noncontrast CT. No portal venous gas. There is
no adenopathy.

Reproductive: The prostate and seminal vesicles are grossly
unremarkable.

Other: None

Musculoskeletal: No acute or significant osseous findings.
IMPRESSION: 1. A punctate distal left ureteral calculus with mild fullness of
the left ureter.
2. No bowel obstruction or active inflammation. Normal appendix.

## 2020-06-19 ENCOUNTER — Other Ambulatory Visit: Payer: BC Managed Care – PPO

## 2020-06-19 DIAGNOSIS — Z20822 Contact with and (suspected) exposure to covid-19: Secondary | ICD-10-CM

## 2020-06-20 LAB — NOVEL CORONAVIRUS, NAA: SARS-CoV-2, NAA: NOT DETECTED

## 2020-06-20 LAB — SARS-COV-2, NAA 2 DAY TAT

## 2021-11-03 ENCOUNTER — Other Ambulatory Visit: Payer: Self-pay

## 2021-11-03 ENCOUNTER — Emergency Department (HOSPITAL_BASED_OUTPATIENT_CLINIC_OR_DEPARTMENT_OTHER)
Admission: EM | Admit: 2021-11-03 | Discharge: 2021-11-03 | Disposition: A | Discharge status: Home / Self Care | Payer: BC Managed Care – PPO | Attending: Emergency Medicine | Admitting: Emergency Medicine

## 2021-11-03 ENCOUNTER — Encounter (HOSPITAL_BASED_OUTPATIENT_CLINIC_OR_DEPARTMENT_OTHER): Payer: Self-pay

## 2021-11-03 DIAGNOSIS — L02215 Cutaneous abscess of perineum: Secondary | ICD-10-CM

## 2021-11-03 DIAGNOSIS — L0291 Cutaneous abscess, unspecified: Secondary | ICD-10-CM

## 2021-11-03 DIAGNOSIS — N492 Inflammatory disorders of scrotum: Secondary | ICD-10-CM | POA: Diagnosis present

## 2021-11-03 DIAGNOSIS — K610 Anal abscess: Secondary | ICD-10-CM | POA: Insufficient documentation

## 2021-11-03 HISTORY — DX: Anxiety disorder, unspecified: F41.9

## 2021-11-03 HISTORY — DX: Depression, unspecified: F32.A

## 2021-11-03 MED ORDER — LIDOCAINE HCL (PF) 1 % IJ SOLN
10.0000 mL | Freq: Once | INTRAMUSCULAR | Status: AC
  Administered 2021-11-03: 10 mL
  Filled 2021-11-03: qty 10

## 2021-11-03 MED ORDER — SULFAMETHOXAZOLE-TRIMETHOPRIM 800-160 MG PO TABS: 1.0000 | ORAL_TABLET | Freq: Two times a day (BID) | ORAL | 0 refills | Status: AC

## 2021-11-03 MED ORDER — HYDROCODONE-ACETAMINOPHEN 5-325 MG PO TABS: 1.0000 | ORAL_TABLET | ORAL | 0 refills | Status: AC | PRN

## 2021-11-03 NOTE — Discharge Instructions (Addendum)
Return if any problems.  Soak area 20 minutes 4 times a day °

## 2021-11-03 NOTE — ED Provider Notes (Signed)
[facility] EMERGENCY DEPARTMENT Provider Note   CSN: [id number] Arrival date & time: 11/03/21  1202     History  Chief Complaint  Patient presents with   Abscess    Anthony Cruz is a 31 y.o. male.  Patient reports he has a swollen tender area between his nose and scrotum.  Patient reports he was soaking the area and had some drainage earlier in the week.  Patient was seen at urgent care and referred to the emergency department for evaluation as they were not comfortable draining this area.  Patient denies any fever or chills.  He is not diabetic.  Patient reports that he has been soaking twice a day.  He has attempted to squeeze infection from the area without relief  The history is provided by the patient. No language interpreter was used.  Abscess Location:  Torso Size:  2 Abscess quality: painful, redness and warmth   Progression:  Worsening     Home Medications Prior to Admission medications   Medication Sig Start Date End Date Taking? Authorizing Provider  HYDROcodone-acetaminophen (NORCO/VICODIN) 5-325 MG tablet Take 1 tablet by mouth every 4 (four) hours as needed for moderate pain. 11/03/21 11/03/22 Yes Cheron Schaumann K, PA-C  sulfamethoxazole-trimethoprim (BACTRIM DS) 800-160 MG tablet Take 1 tablet by mouth 2 (two) times daily. 11/03/21  Yes Cheron Schaumann K, PA-C  albuterol (VENTOLIN HFA) 108 (90 Base) MCG/ACT inhaler Inhale 1-2 puffs into the lungs every 6 (six) hours as needed for wheezing or shortness of breath.     [provider]  amitriptyline (ELAVIL) 25 MG tablet Take 25 mg by mouth at bedtime. 11/10/19   [provider]  busPIRone (BUSPAR) 10 MG tablet Take 10 mg by mouth 2 (two) times daily. 11/05/19   [provider]  cyclobenzaprine (FLEXERIL) 10 MG tablet Take 1 tablet (10 mg total) by mouth at bedtime. 12/04/19   Wurst, [place], PA-C  fluticasone (FLONASE) 50 MCG/ACT nasal spray Place 2 sprays into both nostrils daily as  needed for allergies or rhinitis.  11/01/19   [provider]  montelukast (SINGULAIR) 10 MG tablet Take 10 mg by mouth daily. 11/10/19   [provider]  SUMAtriptan (IMITREX) 50 MG tablet Take 50 mg by mouth every 2 (two) hours as needed for migraine or headache.     [provider]  zolpidem (AMBIEN) 10 MG tablet Take 10 mg by mouth at bedtime as needed for sleep.  10/25/19   [provider]      Allergies    Patient has no known allergies.    Review of Systems   Review of Systems  Skin:  Positive for wound.  All other systems reviewed and are negative.  Physical Exam Updated Vital Signs BP (!) 128/93 (BP Location: Left Arm)   Pulse 85   Temp 98.4 F (36.9 C) (Oral)   Resp 16   Ht 6\' 1"  (1.854 m)   Wt 79.8 kg   SpO2 100%   BMI 23.22 kg/m  Physical Exam Vitals reviewed.  Cardiovascular:     Rate and Rhythm: Normal rate.  Pulmonary:     Effort: Pulmonary effort is normal.  Musculoskeletal:        General: Swelling and tenderness present.     Comments: Approximately 2 cm area of swelling and tenderness between patient's anus and scrotum.  Area is tender to palpation.  There is no extension into the scrotum.  Does not appear to be any extension into the  rectum.  Skin:    General: Skin is warm.  Neurological:     General: No focal deficit present.     Mental Status: He is alert.  Psychiatric:        Mood and Affect: Mood normal.    ED Results / Procedures / Treatments   Labs (all labs ordered are listed, but only abnormal results are displayed) Labs Reviewed - No data to display  EKG None  Radiology No results found.  Procedures .Marland Kitchen.Incision and Drainage  Date/Time: 11/03/2021 5:56 PM Performed by: Elson AreasSofia, Annesha Delgreco K, PA-C Authorized by: Elson AreasSofia, Bushra Denman K, PA-C   Consent:    Consent obtained:  Verbal   Consent given by:  Patient   Risks, benefits, and alternatives were discussed: yes     Risks discussed:  Bleeding, incomplete  drainage and infection   Alternatives discussed:  No treatment Universal protocol:    Procedure explained and questions answered to patient or proxy's satisfaction: yes     Immediately prior to procedure, a time out was called: yes     Patient identity confirmed:  Verbally with patient Location:    Type:  Abscess   Size:  2   Location:  Anogenital   Anogenital location:  Perineum Pre-procedure details:    Skin preparation:  Povidone-iodine Sedation:    Sedation type:  None Anesthesia:    Anesthesia method:  Local infiltration   Local anesthetic:  Lidocaine 1% w/o epi Procedure type:    Complexity:  Complex Procedure details:    Ultrasound guidance: no     Needle aspiration: no     Incision types:  Single straight   Incision depth:  Subcutaneous   Wound management:  Probed and deloculated and irrigated with saline   Drainage:  Purulent   Drainage amount:  Moderate   Wound treatment:  Wound left open Post-procedure details:    Procedure completion:  Tolerated well, no immediate complications Comments:     RN chaperone present during procedure.  Approximately 5 cc foul-smelling purulent drainage, area is probed deloculated irrigated with saline.  Patient reports significant relief when the area began draining    Medications Ordered in ED Medications  lidocaine (PF) (XYLOCAINE) 1 % injection 10 mL (10 mLs Infiltration Given by Other 11/03/21 1253)    ED Course/ Medical Decision Making/ A&P                           Medical Decision Making Patient reports a swollen area since Wednesday  Problems Addressed: Perineal abscess: acute illness or injury    Details: Patient diagnosed with an abscess to urgent care and sent to the emergency department for evaluation  Amount and/or Complexity of Data Reviewed Independent Historian: spouse    Details: Patient here with spouse who is supportive External Data Reviewed: notes.    Details: Urgent care notes reviewed.  There was  concern breast for scrotal involvement and perirectal involvement.  There does not appear to be any scrotal involvement or any rectal involvement.  Abscessed area seems limited to the perineal area  Risk Prescription drug management. Risk Details: I&D is successful patient is given a prescription for cycling and hydrocodone he is advised to return to the emergency department if symptoms worsen or change.           Final Clinical Impression(s) / ED Diagnoses Final diagnoses:  Abscess  Perineal abscess    Rx / DC Orders ED Discharge Orders  Ordered    sulfamethoxazole-trimethoprim (BACTRIM DS) 800-160 MG tablet  2 times daily        11/03/21 1323    HYDROcodone-acetaminophen (NORCO/VICODIN) 5-325 MG tablet  Every 4 hours PRN        11/03/21 1323           An After Visit Summary was printed and given to the patient.    Elson Areas, PA-C 11/03/21 1801    Melene Plan, DO 11/04/21 [phone]

## 2021-11-03 NOTE — ED Triage Notes (Signed)
Pt reports he has abscess right inner buttocks going to rectum. Felt pressure on Tuesday. Pt went to urgent care today and was sent her due to perirectal abscess. Pt reports fever at home and clear drainage
